# Patient Record
Sex: Female | Born: 1980 | Race: White | Hispanic: No | Marital: Married | Smoking: Never smoker
Health system: Southern US, Community
[De-identification: ages and names within clinical notes are randomized; demographics above are authoritative.]

## PROBLEM LIST (undated history)

## (undated) DIAGNOSIS — K635 Polyp of colon: Secondary | ICD-10-CM

## (undated) DIAGNOSIS — R109 Unspecified abdominal pain: Secondary | ICD-10-CM

## (undated) DIAGNOSIS — N920 Excessive and frequent menstruation with regular cycle: Secondary | ICD-10-CM

## (undated) DIAGNOSIS — N83209 Unspecified ovarian cyst, unspecified side: Secondary | ICD-10-CM

## (undated) DIAGNOSIS — E079 Disorder of thyroid, unspecified: Secondary | ICD-10-CM

## (undated) DIAGNOSIS — K625 Hemorrhage of anus and rectum: Secondary | ICD-10-CM

## (undated) DIAGNOSIS — K6289 Other specified diseases of anus and rectum: Secondary | ICD-10-CM

## (undated) HISTORY — DX: Polyp of colon: K63.5

## (undated) HISTORY — DX: Unspecified abdominal pain: R10.9

## (undated) HISTORY — DX: Other specified diseases of anus and rectum: K62.89

## (undated) HISTORY — DX: Hemorrhage of anus and rectum: K62.5

## (undated) HISTORY — PX: CYSTECTOMY: SUR359

## (undated) HISTORY — DX: Excessive and frequent menstruation with regular cycle: N92.0

## (undated) HISTORY — DX: Unspecified ovarian cyst, unspecified side: N83.209

## (undated) HISTORY — PX: CERVIX LESION DESTRUCTION: SHX591

## (undated) HISTORY — DX: Disorder of thyroid, unspecified: E07.9

---
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
## 2000-02-17 HISTORY — PX: CYSTECTOMY: SUR359

## 2000-11-09 ENCOUNTER — Ambulatory Visit (HOSPITAL_COMMUNITY): Admission: RE | Admit: 2000-11-09 | Discharge: 2000-11-09 | Payer: Self-pay | Admitting: General Surgery

## 2001-02-11 ENCOUNTER — Other Ambulatory Visit: Admission: RE | Admit: 2001-02-11 | Discharge: 2001-02-11 | Payer: Self-pay | Admitting: Obstetrics and Gynecology

## 2001-06-07 ENCOUNTER — Emergency Department (HOSPITAL_COMMUNITY): Admission: EM | Admit: 2001-06-07 | Discharge: 2001-06-07 | Payer: Self-pay | Admitting: Emergency Medicine

## 2002-02-06 ENCOUNTER — Encounter: Payer: Self-pay | Admitting: Family Medicine

## 2002-02-06 ENCOUNTER — Ambulatory Visit (HOSPITAL_COMMUNITY): Admission: RE | Admit: 2002-02-06 | Discharge: 2002-02-06 | Payer: Self-pay | Admitting: Family Medicine

## 2002-03-08 ENCOUNTER — Other Ambulatory Visit: Admission: RE | Admit: 2002-03-08 | Discharge: 2002-03-08 | Payer: Self-pay | Admitting: Obstetrics & Gynecology

## 2003-06-07 ENCOUNTER — Observation Stay (HOSPITAL_COMMUNITY): Admission: EM | Admit: 2003-06-07 | Discharge: 2003-06-08 | Payer: Self-pay | Admitting: Emergency Medicine

## 2004-03-21 ENCOUNTER — Ambulatory Visit (HOSPITAL_COMMUNITY): Admission: RE | Admit: 2004-03-21 | Discharge: 2004-03-21 | Payer: Self-pay | Admitting: Obstetrics and Gynecology

## 2004-12-03 ENCOUNTER — Emergency Department (HOSPITAL_COMMUNITY): Admission: EM | Admit: 2004-12-03 | Discharge: 2004-12-03 | Payer: Self-pay | Admitting: Emergency Medicine

## 2005-03-25 ENCOUNTER — Ambulatory Visit (HOSPITAL_COMMUNITY): Admission: RE | Admit: 2005-03-25 | Discharge: 2005-03-25 | Payer: Self-pay | Admitting: Obstetrics and Gynecology

## 2006-02-19 ENCOUNTER — Ambulatory Visit (HOSPITAL_COMMUNITY): Admission: AD | Admit: 2006-02-19 | Discharge: 2006-02-19 | Payer: Self-pay | Admitting: Obstetrics and Gynecology

## 2006-03-22 ENCOUNTER — Observation Stay (HOSPITAL_COMMUNITY): Admission: AD | Admit: 2006-03-22 | Discharge: 2006-03-22 | Payer: Self-pay | Admitting: Obstetrics and Gynecology

## 2006-05-03 ENCOUNTER — Inpatient Hospital Stay (HOSPITAL_COMMUNITY): Admission: RE | Admit: 2006-05-03 | Discharge: 2006-05-05 | Payer: Self-pay | Admitting: Obstetrics and Gynecology

## 2006-05-03 ENCOUNTER — Encounter (INDEPENDENT_AMBULATORY_CARE_PROVIDER_SITE_OTHER): Payer: Self-pay | Admitting: *Deleted

## 2006-10-20 ENCOUNTER — Ambulatory Visit (HOSPITAL_COMMUNITY): Admission: RE | Admit: 2006-10-20 | Discharge: 2006-10-20 | Payer: Self-pay | Admitting: Family Medicine

## 2006-11-03 ENCOUNTER — Ambulatory Visit (HOSPITAL_COMMUNITY): Admission: RE | Admit: 2006-11-03 | Discharge: 2006-11-03 | Payer: Self-pay | Admitting: Family Medicine

## 2007-03-29 ENCOUNTER — Ambulatory Visit: Payer: Self-pay | Admitting: Gastroenterology

## 2007-07-22 ENCOUNTER — Other Ambulatory Visit: Admission: RE | Admit: 2007-07-22 | Discharge: 2007-07-22 | Payer: Self-pay | Admitting: Obstetrics and Gynecology

## 2007-12-19 ENCOUNTER — Telehealth: Payer: Self-pay | Admitting: Gastroenterology

## 2007-12-21 DIAGNOSIS — K625 Hemorrhage of anus and rectum: Secondary | ICD-10-CM

## 2007-12-21 DIAGNOSIS — Z860100 Personal history of colon polyps, unspecified: Secondary | ICD-10-CM | POA: Insufficient documentation

## 2007-12-21 DIAGNOSIS — K6289 Other specified diseases of anus and rectum: Secondary | ICD-10-CM | POA: Insufficient documentation

## 2007-12-21 DIAGNOSIS — K649 Unspecified hemorrhoids: Secondary | ICD-10-CM | POA: Insufficient documentation

## 2007-12-21 DIAGNOSIS — R109 Unspecified abdominal pain: Secondary | ICD-10-CM | POA: Insufficient documentation

## 2007-12-21 DIAGNOSIS — K5900 Constipation, unspecified: Secondary | ICD-10-CM | POA: Insufficient documentation

## 2007-12-22 ENCOUNTER — Ambulatory Visit: Payer: Self-pay | Admitting: Gastroenterology

## 2007-12-22 LAB — CONVERTED CEMR LAB
ALT: 12 units/L (ref 0–35)
Alkaline Phosphatase: 66 units/L (ref 39–117)
Basophils Absolute: 0 10*3/uL (ref 0.0–0.1)
Basophils Relative: 0.4 % (ref 0.0–3.0)
Calcium: 9.4 mg/dL (ref 8.4–10.5)
Chloride: 105 meq/L (ref 96–112)
Creatinine, Ser: 0.7 mg/dL (ref 0.4–1.2)
Folate: 9.7 ng/mL
GFR calc Af Amer: 128 mL/min
HCT: 38.1 % (ref 36.0–46.0)
Hemoglobin: 13.3 g/dL (ref 12.0–15.0)
MCHC: 35 g/dL (ref 30.0–36.0)
Monocytes Absolute: 0.5 10*3/uL (ref 0.1–1.0)
Monocytes Relative: 8 % (ref 3.0–12.0)
RBC: 4.38 M/uL (ref 3.87–5.11)
RDW: 11.5 % (ref 11.5–14.6)
Saturation Ratios: 35.8 % (ref 20.0–50.0)
Sodium: 141 meq/L (ref 135–145)
TSH: 4.2 microintl units/mL (ref 0.35–5.50)
Total Protein: 7.6 g/dL (ref 6.0–8.3)
Vitamin B-12: 416 pg/mL (ref 211–911)

## 2007-12-26 ENCOUNTER — Ambulatory Visit: Payer: Self-pay | Admitting: Gastroenterology

## 2007-12-26 ENCOUNTER — Encounter: Payer: Self-pay | Admitting: Gastroenterology

## 2007-12-28 ENCOUNTER — Encounter: Payer: Self-pay | Admitting: Gastroenterology

## 2008-01-07 ENCOUNTER — Emergency Department (HOSPITAL_COMMUNITY): Admission: EM | Admit: 2008-01-07 | Discharge: 2008-01-07 | Payer: Self-pay | Admitting: Emergency Medicine

## 2008-09-26 ENCOUNTER — Ambulatory Visit: Payer: Self-pay

## 2008-09-26 DIAGNOSIS — R0989 Other specified symptoms and signs involving the circulatory and respiratory systems: Secondary | ICD-10-CM | POA: Insufficient documentation

## 2008-09-26 DIAGNOSIS — R000 Tachycardia, unspecified: Secondary | ICD-10-CM

## 2008-09-26 DIAGNOSIS — R079 Chest pain, unspecified: Secondary | ICD-10-CM

## 2008-09-28 ENCOUNTER — Encounter: Payer: Self-pay | Admitting: Adult Health

## 2008-09-28 LAB — CONVERTED CEMR LAB
BUN: 11 mg/dL (ref 6–23)
CO2: 23 meq/L (ref 19–32)
Calcium: 8.8 mg/dL (ref 8.4–10.5)
Glucose, Bld: 100 mg/dL — ABNORMAL HIGH (ref 70–99)
HCT: 37.5 % (ref 36.0–46.0)
Hemoglobin: 12.6 g/dL (ref 12.0–15.0)

## 2008-10-08 ENCOUNTER — Encounter: Payer: Self-pay | Admitting: Cardiology

## 2008-10-16 ENCOUNTER — Encounter: Payer: Self-pay | Admitting: Cardiology

## 2008-10-16 ENCOUNTER — Ambulatory Visit (HOSPITAL_COMMUNITY): Admission: RE | Admit: 2008-10-16 | Discharge: 2008-10-16 | Payer: Self-pay | Admitting: Cardiology

## 2008-10-16 ENCOUNTER — Ambulatory Visit: Payer: Self-pay | Admitting: Cardiology

## 2008-10-18 ENCOUNTER — Encounter (INDEPENDENT_AMBULATORY_CARE_PROVIDER_SITE_OTHER): Payer: Self-pay | Admitting: *Deleted

## 2008-10-24 ENCOUNTER — Ambulatory Visit: Payer: Self-pay | Admitting: Cardiology

## 2008-12-05 ENCOUNTER — Other Ambulatory Visit: Admission: RE | Admit: 2008-12-05 | Discharge: 2008-12-05 | Payer: Self-pay | Admitting: Obstetrics and Gynecology

## 2009-05-07 ENCOUNTER — Encounter: Payer: Self-pay | Admitting: Adult Health

## 2009-10-28 ENCOUNTER — Ambulatory Visit (HOSPITAL_COMMUNITY): Admission: RE | Admit: 2009-10-28 | Discharge: 2009-10-28 | Payer: Self-pay | Admitting: Internal Medicine

## 2010-01-10 ENCOUNTER — Inpatient Hospital Stay (HOSPITAL_COMMUNITY)
Admission: AD | Admit: 2010-01-10 | Discharge: 2010-01-10 | Payer: Self-pay | Source: Home / Self Care | Admitting: Obstetrics & Gynecology

## 2010-01-24 ENCOUNTER — Inpatient Hospital Stay (HOSPITAL_COMMUNITY)
Admission: AD | Admit: 2010-01-24 | Discharge: 2010-01-24 | Payer: Self-pay | Source: Home / Self Care | Attending: Family Medicine | Admitting: Family Medicine

## 2010-01-26 ENCOUNTER — Inpatient Hospital Stay
Admission: AD | Admit: 2010-01-26 | Discharge: 2010-01-26 | Payer: Self-pay | Source: Home / Self Care | Attending: Obstetrics & Gynecology | Admitting: Obstetrics & Gynecology

## 2010-01-27 ENCOUNTER — Inpatient Hospital Stay (HOSPITAL_COMMUNITY)
Admission: AD | Admit: 2010-01-27 | Discharge: 2010-01-29 | Payer: Self-pay | Source: Home / Self Care | Attending: Obstetrics & Gynecology | Admitting: Obstetrics & Gynecology

## 2010-01-28 ENCOUNTER — Encounter: Payer: Self-pay | Admitting: Obstetrics & Gynecology

## 2010-04-29 LAB — URINALYSIS, ROUTINE W REFLEX MICROSCOPIC
Bilirubin Urine: NEGATIVE
Hgb urine dipstick: NEGATIVE
Ketones, ur: 40 mg/dL — AB

## 2010-04-29 LAB — CBC
Hemoglobin: 10.5 g/dL — ABNORMAL LOW (ref 12.0–15.0)
MCH: 26.5 pg (ref 26.0–34.0)
Platelets: 396 10*3/uL (ref 150–400)
RBC: 3.97 MIL/uL (ref 3.87–5.11)
RDW: 13 % (ref 11.5–15.5)
WBC: 20.1 10*3/uL — ABNORMAL HIGH (ref 4.0–10.5)

## 2010-04-29 LAB — RPR: RPR Ser Ql: NONREACTIVE

## 2010-04-29 LAB — AMNISURE RUPTURE OF MEMBRANE (ROM) NOT AT ARMC: Amnisure ROM: NEGATIVE

## 2010-07-01 NOTE — Assessment & Plan Note (Signed)
[name], [name]                  CHART#:  [id number]   DATE:  03/29/2007                       DOB:  June 03, 1980   DATE OF VISIT:  03/29/2007   PRIMARY PHYSICIAN:  Elpidio Eric   REASON FOR VISIT:  Rectal bleeding.   HISTORY OF PRESENT ILLNESS:  [name] is a 30 year old female who has  had difficulty moving her bowels since she was a teenager.  She reports  having a bowel movement once a week which is hard and associated with  rectal bleeding.  She reports having exploratory laparotomy for the  possibility of Crohn disease which was negative.  She does have a  maternal grandfather who has Crohn disease.  She has used Proctofoam in  the past for hemorrhoids.  She has associated abdominal di  abdominal  discomfort, cramping and tearing pain in her rectum, and she has a bowel  movement.  She has tried Dulcolax and Colace without satisfactory relief  of her constipation.  Caster oil does help.  She denies any weight loss.  She is nauseated and vomits one to two times a week which are associated  with bad bouts of constipation.  She denies any sores in her mouth or  rash on her legs.  She does not have any diarrhea.  She is breast  feeding.  The child was 93 months old.  Fiber bars cause bloating.  She  denies any heartburn, indigestion or dysphagia.  Her appetite is okay.  Sometimes, when she burps, she said it smells like a bowel movement.  She had her last abdominal imaging in February of 2008 for right upper  quadrant pain, and the exam was completely normal.   PAST MEDICAL HISTORY:  She reports having a history of polyps, but those  records are unavailable.   PAST SURGICAL HISTORY:  Appendectomy.   ALLERGIES:  NO KNOWN DRUG ALLERGIES.   MEDICATIONS:  1. Multivitamin.  2. Proctofoam as needed.  3. Colace as needed.  4. Dulcolax suppositories as needed.  5. Tylenol as needed.   FAMILY HISTORY:  She has no family history of colon cancer and colon  polyps.   SOCIAL  HISTORY:  She is married and she has two children.  Her children  have no GI issues.  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  She does not smoke or  drink any alcohol.   REVIEW OF SYSTEMS:  Per the HPI, otherwise all systems are negative.   PHYSICAL EXAMINATION:  VITAL SIGNS:  Weight 138 pounds, height 5 foot, 4  inches, BMI 23.7 (healthy), temperature 98.2, blood pressure 100/60,  pulse 72.GENERAL:  She is in no apparent distress, alert and oriented  x4.HEENT EXAM:  Atraumatic, normocephalic, pupils are equal and react to  light.  Mouth:  No oral lesions.  Posterior pharynx without erythema or  exudate.NECK:  Has full range of motion, no lymphadenopathy.LUNGS:  Clear to auscultation bilaterally.CARDIOVASCULAR EXAM:  Regular rhythm,  no murmur, no S1, S2.ABDOMEN:  Bowel sounds are present, soft,  nontender, nondistended, no rebound or guarding.  She has no abdominal  bruits.NEUROLOGIC:  She has no focal neurologic deficits.EXTREMITIES:  No cyanosis or edema.  SKIN:  No pretibial lesions.   ASSESSMENT:  [name] is a 30 year old female who has a longstanding  history of constipation which is likely  secondary to irritable bowel  syndrome.  Differential diagnosis includes thyroid disturbance or  electrolyte disturbance.   RECOMMENDATIONS:  1. She used to take Amitiza 8 mcg twice daily.  She has given four      samples, and a prescription.  2. She was given instructions to add Colace twice a day and to      continue a high-fiber diet.  She is to find the fiber that works      for her and to avoid bloating.  3. Will check the CMP and the TSH to evaluate for electrolyte or      thyroid disturbance.  4. Outpatient visit in two months.  I will obtain record from [facility] in regards to her prior workup and determine whether or not a      colonoscopy is necessary in the future.       Kassie Mends, M.D.  Electronically Signed     SM/MEDQ  D:  03/29/2007  T:  03/30/2007  Job:   [id number]   cc:   Patrica Duel, M.D.

## 2010-07-04 NOTE — Op Note (Signed)
[name], [name]                 ACCOUNT NO.:  [id number]   MEDICAL RECORD NO.:  [id number]          PATIENT TYPE:  INP   LOCATION:  A412                          FACILITY:  [facility]   PHYSICIAN:  Tilda Burrow, M.D. DATE OF BIRTH:  05/23/1980   DATE OF PROCEDURE:  05/03/2006  DATE OF DISCHARGE:                                PROCEDURE NOTE   PROCEDURE:  Epidural catheter placement.   ATTENDING:  Tilda Burrow, M.D.   TIME:  11 a.m.   DESCRIPTION OF PROCEDURE:  A continuous lumbar epidural catheter was  placed with the patient in the sitting position and flexed forward with  a loss-of-resistance technique attempted.  First attempt at L2-3  interspace was unsuccessful due to bony obstruction, despite the fact  the patient had a slim back.  We moved down 1 interspace and attempted  again and though the interspace was quite narrow, we were able to  identify the interspace at a 6-cm depth using standard loss-of-  resistance technique.  The catheter threaded 3 cm into the space without  difficulty.  We gave her a 3-mL test dose of 1.5%  Xylocaine with  epinephrine prior to insertion of the catheter.  We then proceeded  directly to the test dose, 7 mL of 0.125% Marcaine solution with 2  mcg/mL of fentanyl.  The patient had a slow setup of the analgesic  effect and at 15 minutes, claimed that she was not getting any relief,  but subsequently moved rapidly in labor and showed evidence of analgesia  during the labor and delivery process.   Catheter had been taped to the back in a standard fashion; see nursing  notes for further details.      Tilda Burrow, M.D.  Electronically Signed     JVF/MEDQ  D:  05/03/2006  T:  05/04/2006  Job:  [id number]   cc:   [facility]

## 2010-07-04 NOTE — H&P (Signed)
NAMEKIRRA, VERGA                 ACCOUNT NO.:  [id number]   MEDICAL RECORD NO.:  [id number]          PATIENT TYPE:  OIB   LOCATION:  LDR3                          FACILITY:  [facility]   PHYSICIAN:  Tilda Burrow, M.D. DATE OF BIRTH:  1980-11-17   DATE OF ADMISSION:  DATE OF DISCHARGE:  LH                              HISTORY & PHYSICAL   REASON FOR ADMISSION:  Pregnancy at 34 weeks and 3 days, who awoke this  morning with heavy bleeding and cramping and presents to the office for  evaluation.   MEDICAL HISTORY:  Positive for abnormal Paps.   SURGICAL HISTORY:  Positive for appendectomy and ovarian cyst removal  and cryo on her cervix.   FAMILY HISTORY:  Positive for breast cancer, coronary artery disease and  diabetes.   PRENATAL COURSE:  Essentially uneventful, up until this point.  Blood  type is O positive, UDS negative.  Rubella is immune.  Hep B surface  antigen is negative.  HIV is nonreactive.  HSV:  1 is positive, 2 is  negative.  Serology is nonreactive.  Pap normal.  GC and chlamydia are  negative, AFP normal, 28-week hemoglobin 9.5, 28-week hematocrit 34.5.  One-hour glucose was 100.   PHYSICAL EXAMINATION:  VITAL SIGNS:  Today:  Weight is 165, blood  pressure 100/60.  She has 3+ blood in her urine.  She is having some  small amount of darkish bleeding at present time.  Fetal heart rate is  160, strong and regular.  ABDOMEN:  Soft but tender with palpation.  Patient states there is good  fetal movement noted.  HEART:  Regular rhythm and rate.  LUNGS:  Clear to auscultation bilaterally.   PLAN:  We are going to admit, observation, get an ultrasound to evaluate  fetal well-being and to rule out abruptions.      [name], [name]      Tilda Burrow, M.D.  Electronically Signed    DL/MEDQ  D:  16/11/9602  T:  03/22/2006  Job:  [id number]

## 2010-07-04 NOTE — Op Note (Signed)
[name], [name]                 ACCOUNT NO.:  [id number]   MEDICAL RECORD NO.:  [id number]          PATIENT TYPE:  INP   LOCATION:  LDR4                          FACILITY:  [facility]   PHYSICIAN:  Tilda Burrow, M.D. DATE OF BIRTH:  Nov 19, 1980   DATE OF PROCEDURE:  05/03/2006  DATE OF DISCHARGE:                                PROCEDURE NOTE   DELIVERY SUMMARY:   ONSET OF LABOR:  May 03, 2006.   DATE OF DELIVERY:  May 03, 2006 at 12:03.   LENGTH OF FIRST STAGE LABOR:  Four hours and 50 minutes.   LENGTH OF SECOND STAGE LABOR:  Thirteen minutes.   LENGTH OF THIRD STAGE LABOR:  Seventeen minutes.   DELIVERY NOTE:  [name] had a normal spontaneous vaginal delivery.  Upon  delivery of head, there was a loose nuchal cord noted which was easily  reduced.  The shoulders were trapped momentarily under the symphysis  pubis.  McRobert's procedure was performed, and also wood screw maneuver  which resolved the trapped shoulders and had rotation and spontaneous  delivery of the infant without complications.  Apgars were 9 and 9.  There was a second degree perineal laceration upon delivery of the  infant.  It was thoroughly suctioned and DeLee'd on the perineum,  suctioned again after the baby was delivered, the infant dried, cord  clamped and cut, and to the newborn nurses for nursery care.  The third  stage of labor was actually managed with 20 units of Pitocin, 1000 cc of  D-5 lactated Ringers at a rapid rate.  Cord blood and cord blood gas was  obtained.  Placenta was delivered spontaneously via Tomasa Blase mechanism.  A 3-vessel cord was noted.  Membranes were noted to be intact upon  inspection.  Estimated blood loss approximately 400 cc.  A second degree  perineal laceration was repaired with 10 cc of 1% lidocaine and  repaired with 2 interrupted sutures and subcuticular to close with 2-0  Vicryl.  Good hemostasis was obtained.  Infant and mother stabilized.  Epidural catheter was  removed with blue chip intact.  Both were  transferred out to the postpartum unit in good condition.      [name], [name]      Tilda Burrow, M.D.  Electronically Signed    DL/MEDQ  D:  11/91/4782  T:  05/03/2006  Job:  [id number]   cc:   Dr. Gerda Diss   [facility]

## 2010-07-04 NOTE — H&P (Signed)
[name], [name]                 ACCOUNT NO.:  [id number]   MEDICAL RECORD NO.:  [id number]          PATIENT TYPE:  INP   LOCATION:  LDR4                          FACILITY:  [facility]   PHYSICIAN:  Tilda Burrow, M.D. DATE OF BIRTH:  [birth year]   DATE OF ADMISSION:  DATE OF DISCHARGE:  LH                              HISTORY & PHYSICAL   CHIEF COMPLAINT:  Elective induction of labor secondary to post dates.   HISTORY OF PRESENT ILLNESS:  [name] is a 30 year old gravida 2, para 1  with an EDC of May 01, 2006 based on known last menstrual period and  first and second trimester correlating ultrasounds placing her at 40  weeks 3 days gestation. She began prenatal care in the first trimester  and has had regular visits since then.  Prenatal course has been  completely uneventful. Blood pressures have been 100s to 120s over 60s  to 80s. Total weight gain has been 30 pounds with appropriate fundal  height growth. Prenatal labs include blood type 0+, rubella immune,  HBsAg, HIV, RPR, gonorrhea, Chlamydia are all negative. Group B strep is  positive, varicella is negative. HSV type 2 as also negative.   PAST MEDICAL HISTORY:  Positive for hematuria with negative workup.   SURGICAL HISTORY:  Appendectomy, ovarian cyst removal, and cryosurgery.   ALLERGIES:  No allergies.   MEDICATIONS:  Prenatal vitamins.   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   FAMILY HISTORY:  Positive for breast cancer, CAD, diabetes.   OBSTETRIC HISTORY:  Had a 12-hour labor that resulted in an 8 pound 2  ounce female infant delivered spontaneously with an epidural.   PHYSICAL EXAMINATION:  HEENT: Within normal limits.  HEART:  Regular rate and rhythm.  LUNGS:  Clear.  ABDOMEN:  Soft and nontender. Fundal height is 40 cm. Cervical exam  today was 1 cm long,  -1 station.  EXTREMITIES:  Legs are negative.   IMPRESSION:  IUP at 40-1/2 weeks, elective induction of labor per  maternal  request.   PLAN:  Will admit on the evening of May 03, 2006 and plan a Foley  preinduction with AROM and Pitocin in the morning.      Jacklyn Shell, C.N.M.      Tilda Burrow, M.D.  Electronically Signed    FC/MEDQ  D:  04/27/2006  T:  04/29/2006  Job:  [id number]   cc:   Lorin Picket A. Gerda Diss, MD  Fax: [phone]

## 2010-07-04 NOTE — Discharge Summary (Signed)
NAMETASHEEMA, PERRONE                          ACCOUNT NO.:  [id number]   MEDICAL RECORD NO.:  [id number]                   PATIENT TYPE:  INP   LOCATION:  A331                                 FACILITY:  [facility]   PHYSICIAN:  Corrie Mckusick, M.D.               DATE OF BIRTH:  Jun 22, 1980   DATE OF ADMISSION:  06/07/2003  DATE OF DISCHARGE:  06/08/2003                                 DISCHARGE SUMMARY   HISTORY OF PRESENTING ILLNESS/PAST MEDICAL HISTORY:  Please see admission  H&P.   HOSPITAL COURSE:  [age]-year-old female with no significant past  medical history who presented with 12 hours of acute abdominal cramps,  nausea, vomiting, and diarrhea.  She was found to be hypotensive in the  emergency department and felt like she should be admitted for overnight  fluids and stabilization.  She has done remarkably well since admission and  has had much less nausea which is nearly resolved this morning and diarrhea  has slowed down as well.  Blood pressures are starting to come back up in  the mid 90s/mid 50s which is her baseline blood pressures.  She feels  remarkably well and has been afebrile.   DISCHARGE PHYSICAL:  T-max 99, blood pressure 96/55, heart rate in 90s,  respiratory rate 18.  This morning she was pleasant, talkative, joking and  ready to go home.  Nasopharynx moist mucous membranes.  Chest clear to  auscultation bilaterally.  Cardiovascular regular rate and rhythm; no  murmurs.  Abdomen soft, nontender.   DISCHARGE MEDICATION:  Phenergan 25 mg q.4-6h. p.r.n.   FOLLOW UP:  She is to follow up as needed.  If symptoms reoccur she is to  call or return.     ___________________________________________                                         Corrie Mckusick, M.D.   JCG/MEDQ  D:  06/08/2003  T:  06/08/2003  Job:  [id number]

## 2010-07-04 NOTE — H&P (Signed)
[name] [name]                          ACCOUNT NO.:  [id number]   MEDICAL RECORD NO.:  [id number]                   PATIENT TYPE:  INP   LOCATION:  A331                                 FACILITY:  [facility]   PHYSICIAN:  Corrie Mckusick, M.D.               DATE OF BIRTH:  Oct 06, 1980   DATE OF ADMISSION:  06/07/2003  DATE OF DISCHARGE:                                HISTORY & PHYSICAL   ADMITTING DIAGNOSES:  Gastroenteritis.   ADMITTING CONDITION:  Stable.   HISTORY OF PRESENT ILLNESS:  This is a 30 year old female with no  significant past medical history who has now had 12-16 hours of mid  epigastric nausea, vomiting and cramping.  She has really been able to take  no p.o., and we felt like we should admit her for IV hydration.  She had a  history of pyelonephritis in the past, but has really done well the past few  years.  No blood in the vomitus.  No blood in the diarrhea, mostly just  watery diarrhea.  No recent travel.  No recent foods.  No recent fresh  water.  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   PAST MEDICAL/ PAST SURGICAL HISTORY:  Appendectomy, tonsillectomy in 1988,  appendectomy in 1991.  A cyst removal of her left wrist in 2002.  She does  not drink or smoke.   FAMILY HISTORY:  Noncontributory.   MEDICATIONS:  Birth control pills.   PHYSICAL EXAMINATION:  VITAL SIGNS:  Temperature 100.4, pulse 118,  respiratory rate 20, blood pressure 87/55.  GENERAL:  When I saw her, she was pleasant, sleepy and in no acute distress.  HEENT:  Nasal and oropharynx with moist mucous membranes.  NECK:  No lymphadenopathy.  CHEST:  Clear to auscultation bilaterally.  CARDIOVASCULAR:  Regular rate and rhythm, no murmurs.  ABDOMEN:  Bowel sounds are decreased, soft, mildly tender around the  periumbilical area.  Otherwise clear exam.  EXTREMITIES:  No edema.   LABORATORY DATA:  White count slightly elevated at  11, otherwise clear.  Urinalysis clear.  HCG negative.   ASSESSMENT:  A 30-old gastroenteritis.   PLAN:  1. Admit for IV hydration.  2. Zofran and Phenergan for nausea.  3. We will continue to follow closely and do frequent abdominal to insure     the symptoms do not change.     ___________________________________________                                         Corrie Mckusick, M.D.   JCG/MEDQ  D:  06/07/2003  T:  06/08/2003  Job:  [id number]

## 2010-11-18 LAB — DIFFERENTIAL
Lymphocytes Relative: 18
Lymphs Abs: 2.7
Monocytes Absolute: 0.7
Monocytes Relative: 5
Neutro Abs: 11 — ABNORMAL HIGH
Neutrophils Relative %: 74

## 2010-11-18 LAB — URINALYSIS, ROUTINE W REFLEX MICROSCOPIC
Bilirubin Urine: NEGATIVE
Glucose, UA: NEGATIVE
Ketones, ur: NEGATIVE
Nitrite: POSITIVE — AB
Protein, ur: 100 — AB
Specific Gravity, Urine: >1.03 — ABNORMAL HIGH
Urobilinogen, UA: 0.2
pH: 5.5

## 2010-11-18 LAB — CBC
HCT: 34.8 — ABNORMAL LOW
Hemoglobin: 11.7 — ABNORMAL LOW
MCHC: 33.8
MCV: 86.1
Platelets: 331
RBC: 4.04
RDW: 12.6
WBC: 14.8 — ABNORMAL HIGH

## 2010-11-18 LAB — URINE CULTURE: Colony Count: >=100000

## 2010-11-18 LAB — COMPREHENSIVE METABOLIC PANEL WITH GFR
ALT: 9
AST: 14
Albumin: 3.6
Alkaline Phosphatase: 57
BUN: 7
CO2: 24
Calcium: 8.6
Chloride: 110
Creatinine, Ser: 0.6
GFR calc non Af Amer: >60
Glucose, Bld: 93
Potassium: 3.5
Sodium: 137
Total Bilirubin: 1.1
Total Protein: 6.2

## 2010-11-18 LAB — URINE MICROSCOPIC-ADD ON

## 2010-11-18 LAB — D-DIMER, QUANTITATIVE

## 2010-11-18 LAB — PREGNANCY, URINE: Preg Test, Ur: NEGATIVE

## 2010-12-12 ENCOUNTER — Other Ambulatory Visit (HOSPITAL_COMMUNITY)
Admission: RE | Admit: 2010-12-12 | Discharge: 2010-12-12 | Disposition: A | Discharge status: Home / Self Care | Payer: 59 | Source: Ambulatory Visit | Attending: Obstetrics and Gynecology | Admitting: Obstetrics and Gynecology

## 2010-12-12 ENCOUNTER — Other Ambulatory Visit: Payer: Self-pay | Admitting: Adult Health

## 2010-12-12 DIAGNOSIS — Z01419 Encounter for gynecological examination (general) (routine) without abnormal findings: Secondary | ICD-10-CM | POA: Insufficient documentation

## 2011-02-05 ENCOUNTER — Encounter: Payer: Self-pay | Admitting: Cardiology

## 2011-11-25 ENCOUNTER — Other Ambulatory Visit: Payer: Self-pay | Admitting: Dermatology

## 2012-03-29 ENCOUNTER — Other Ambulatory Visit: Payer: Self-pay | Admitting: Adult Health

## 2012-03-29 ENCOUNTER — Other Ambulatory Visit (HOSPITAL_COMMUNITY)
Admission: RE | Admit: 2012-03-29 | Discharge: 2012-03-29 | Disposition: A | Discharge status: Home / Self Care | Payer: BC Managed Care – PPO | Source: Ambulatory Visit | Attending: Obstetrics and Gynecology | Admitting: Obstetrics and Gynecology

## 2012-03-29 ENCOUNTER — Other Ambulatory Visit (HOSPITAL_COMMUNITY): Payer: Self-pay | Admitting: Adult Health

## 2012-03-29 DIAGNOSIS — Z1151 Encounter for screening for human papillomavirus (HPV): Secondary | ICD-10-CM | POA: Insufficient documentation

## 2012-03-29 DIAGNOSIS — R52 Pain, unspecified: Secondary | ICD-10-CM

## 2012-03-29 DIAGNOSIS — Z01419 Encounter for gynecological examination (general) (routine) without abnormal findings: Secondary | ICD-10-CM | POA: Insufficient documentation

## 2012-03-29 DIAGNOSIS — N63 Unspecified lump in unspecified breast: Secondary | ICD-10-CM

## 2012-03-30 ENCOUNTER — Ambulatory Visit (HOSPITAL_COMMUNITY)
Admission: RE | Admit: 2012-03-30 | Discharge: 2012-03-30 | Disposition: A | Discharge status: Home / Self Care | Payer: BC Managed Care – PPO | Source: Ambulatory Visit | Attending: Adult Health | Admitting: Adult Health

## 2012-03-30 ENCOUNTER — Other Ambulatory Visit (HOSPITAL_COMMUNITY): Payer: Self-pay | Admitting: Adult Health

## 2012-03-30 DIAGNOSIS — N63 Unspecified lump in unspecified breast: Secondary | ICD-10-CM

## 2012-03-30 DIAGNOSIS — R52 Pain, unspecified: Secondary | ICD-10-CM

## 2013-04-03 ENCOUNTER — Other Ambulatory Visit: Payer: Self-pay | Admitting: Adult Health

## 2013-04-19 ENCOUNTER — Other Ambulatory Visit: Payer: Self-pay | Admitting: Adult Health

## 2013-05-02 ENCOUNTER — Encounter: Payer: Self-pay | Admitting: Adult Health

## 2013-05-02 ENCOUNTER — Encounter (INDEPENDENT_AMBULATORY_CARE_PROVIDER_SITE_OTHER): Payer: Self-pay

## 2013-05-02 ENCOUNTER — Ambulatory Visit (INDEPENDENT_AMBULATORY_CARE_PROVIDER_SITE_OTHER): Payer: BC Managed Care – PPO | Admitting: Adult Health

## 2013-05-02 VITALS — BP 120/80 | HR 74 | Ht 64.0 in | Wt 178.5 lb

## 2013-05-02 DIAGNOSIS — N920 Excessive and frequent menstruation with regular cycle: Secondary | ICD-10-CM

## 2013-05-02 DIAGNOSIS — Z01419 Encounter for gynecological examination (general) (routine) without abnormal findings: Secondary | ICD-10-CM

## 2013-05-02 HISTORY — DX: Excessive and frequent menstruation with regular cycle: N92.0

## 2013-05-02 NOTE — Patient Instructions (Signed)
Menorrhagia Menorrhagia is the medical term for when your menstrual periods are heavy or last longer than usual. With menorrhagia, every period you have may cause enough blood loss and cramping that you are unable to maintain your usual activities. CAUSES  In some cases, the cause of heavy periods is unknown, but a number of conditions may cause menorrhagia. Common causes include:  A problem with the hormone-producing thyroid gland (hypothyroid).  Noncancerous growths in the uterus (polyps or fibroids).  An imbalance of the estrogen and progesterone hormones.  One of your ovaries not releasing an egg during one or more months.  Side effects of having an intrauterine device (IUD).  Side effects of some medicines, such as anti-inflammatory medicines or blood thinners.  A bleeding disorder that stops your blood from clotting normally. SIGNS AND SYMPTOMS  During a normal period, bleeding lasts between 4 and 8 days. Signs that your periods are too heavy include:  You routinely have to change your pad or tampon every 1 or 2 hours because it is completely soaked.  You pass blood clots larger than 1 inch (2.5 cm) in size.  You have bleeding for more than 7 days.  You need to use pads and tampons at the same time because of heavy bleeding.  You need to wake up to change your pads or tampons during the night.  You have symptoms of anemia, such as tiredness, fatigue, or shortness of breath. DIAGNOSIS  Your health care provider will perform a physical exam and ask you questions about your symptoms and menstrual history. Other tests may be ordered based on what the health care provider finds during the exam. These tests can include:  Blood tests To check if you are pregnant or have hormonal changes, a bleeding or thyroid disorder, low iron levels (anemia), or other problems.  Endometrial biopsy Your health care provider takes a sample of tissue from the inside of your uterus to be examined  under a microscope.  Pelvic ultrasound This test uses sound waves to make a picture of your uterus, ovaries, and vagina. The pictures can show if you have fibroids or other growths.  Hysteroscopy For this test, your health care provider will use a small telescope to look inside your uterus. Based on the results of your initial tests, your health care provider may recommend further testing. TREATMENT  Treatment may not be needed. If it is needed, your health care provider may recommend treatment with one or more medicines first. If these do not reduce bleeding enough, a surgical treatment might be an option. The best treatment for you will depend on:   Whether you need to prevent pregnancy.  Your desire to have children in the future.  The cause and severity of your bleeding.  Your opinion and personal preference.  Medicines for menorrhagia may include:  Birth control methods that use hormones These include the pill, skin patch, vaginal ring, shots that you get every 3 months, hormonal IUD, and implant. These treatments reduce bleeding during your menstrual period.  Medicines that thicken blood and slow bleeding.  Medicines that reduce swelling, such as ibuprofen.  Medicines that contain a synthetic hormone called progestin.   Medicines that make the ovaries stop working for a short time.  You may need surgical treatment for menorrhagia if the medicines are unsuccessful. Treatment options include:  Dilation and curettage (D&C) In this procedure, your health care provider opens (dilates) your cervix and then scrapes or suctions tissue from the lining of your  uterus to reduce menstrual bleeding.  Operative hysteroscopy This procedure uses a tiny tube with a light (hysteroscope) to view your uterine cavity and can help in the surgical removal of a polyp that may be causing heavy periods.  Endometrial ablation Through various techniques, your health care provider permanently  destroys the entire lining of your uterus (endometrium). After endometrial ablation, most women have little or no menstrual flow. Endometrial ablation reduces your ability to become pregnant.  Endometrial resection This surgical procedure uses an electrosurgical wire loop to remove the lining of the uterus. This procedure also reduces your ability to become pregnant.  Hysterectomy Surgical removal of the uterus and cervix is a permanent procedure that stops menstrual periods. Pregnancy is not possible after a hysterectomy. This procedure requires anesthesia and hospitalization. HOME CARE INSTRUCTIONS   Only take over-the-counter or prescription medicines as directed by your health care provider. Take prescribed medicines exactly as directed. Do not change or switch medicines without consulting your health care provider.  Take any prescribed iron pills exactly as directed by your health care provider. Long-term heavy bleeding may result in low iron levels. Iron pills help replace the iron your body lost from heavy bleeding. Iron may cause constipation. If this becomes a problem, increase the bran, fruits, and roughage in your diet.  Do not take aspirin or medicines that contain aspirin 1 week before or during your menstrual period. Aspirin may make the bleeding worse.  If you need to change your sanitary pad or tampon more than once every 2 hours, stay in bed and rest as much as possible until the bleeding stops.  Eat well-balanced meals. Eat foods high in iron. Examples are leafy green vegetables, meat, liver, eggs, and whole grain breads and cereals. Do not try to lose weight until the abnormal bleeding has stopped and your blood iron level is back to normal. SEEK MEDICAL CARE IF:   You soak through a pad or tampon every 1 or 2 hours, and this happens every time you have a period.  You need to use pads and tampons at the same time because you are bleeding so much.  You need to change your pad  or tampon during the night.  You have a period that lasts for more than 8 days.  You pass clots bigger than 1 inch wide.  You have irregular periods that happen more or less often than once a month.  You feel dizzy or faint.  You feel very weak or tired.  You feel short of breath or feel your heart is beating too fast when you exercise.  You have nausea and vomiting or diarrhea while you are taking your medicine.  You have any problems that may be related to the medicine you are taking. SEEK IMMEDIATE MEDICAL CARE IF:   You soak through 4 or more pads or tampons in 2 hours.  You have any bleeding while you are pregnant. MAKE SURE YOU:   Understand these instructions.  Will watch your condition.  Will get help right away if you are not doing well or get worse. Document Released: 02/02/2005 Document Revised: 11/23/2012 Document Reviewed: 07/24/2012 [facility] LLCExit[facility] Patient Information 2014 [place], [place]. Physical in 1 year Return in 3 weeks for US and see me

## 2013-05-02 NOTE — Progress Notes (Signed)
Patient ID: [name], female   DOB: 10/13/1980, 33 y.o.   MRN: [id number] History of Present Illness:  [name] is a 33 year old white female, married in for a physical.she had a normal pap with negative HPV 03/29/12.  Current Medications, Allergies, Past Medical History, Past Surgical History, Family History and Social History were reviewed in [facility] electronic medical record.     Review of Systems: Patient denies any headaches, blurred vision, shortness of breath, chest pain, abdominal pain, problems with bowel movements, urination, or intercourse. No joint pain or mood swings, she is having heavier periods with clots.    Physical Exam:BP 120/80  Pulse 74  Ht 5\' 4"  (1.626 m)  Wt 178 lb 8 oz (80.967 kg)  BMI 30.62 kg/m2  LMP 04/18/2013 General:  Well developed, well nourished, no acute distress Skin:  Warm and dry Neck:  Midline trachea, normal thyroid Lungs; Clear to auscultation bilaterally Breast:  No dominant palpable mass, retraction, or nipple discharge Cardiovascular: Regular rate and rhythm Abdomen:  Soft, non tender, no hepatosplenomegaly Pelvic:  External genitalia is normal in appearance.  The vagina is normal in appearance. The cervix is bulbous.  Uterus is felt to be normal size, shape, and contour.  No   adnexal masses or tenderness noted. Extremities:  No swelling or varicosities noted Psych:  No mood changes, alert and cooperative, seems happy   Impression: Yearly gyn exam no pap Menorrhagia     Plan: Physical in 1 year Return in 3 weeks for US and see me and check TSH then Review handout on menorrhagia

## 2013-05-18 ENCOUNTER — Other Ambulatory Visit: Payer: Self-pay | Admitting: Adult Health

## 2013-05-18 DIAGNOSIS — N920 Excessive and frequent menstruation with regular cycle: Secondary | ICD-10-CM

## 2013-05-23 ENCOUNTER — Other Ambulatory Visit: Payer: BC Managed Care – PPO

## 2013-05-23 ENCOUNTER — Ambulatory Visit: Payer: BC Managed Care – PPO | Admitting: Adult Health

## 2013-05-30 ENCOUNTER — Ambulatory Visit (INDEPENDENT_AMBULATORY_CARE_PROVIDER_SITE_OTHER): Payer: BC Managed Care – PPO

## 2013-05-30 ENCOUNTER — Ambulatory Visit (INDEPENDENT_AMBULATORY_CARE_PROVIDER_SITE_OTHER): Payer: BC Managed Care – PPO | Admitting: Adult Health

## 2013-05-30 ENCOUNTER — Encounter: Payer: Self-pay | Admitting: Adult Health

## 2013-05-30 VITALS — BP 120/80 | Ht 64.0 in | Wt 178.0 lb

## 2013-05-30 DIAGNOSIS — N920 Excessive and frequent menstruation with regular cycle: Secondary | ICD-10-CM

## 2013-05-30 DIAGNOSIS — N83209 Unspecified ovarian cyst, unspecified side: Secondary | ICD-10-CM

## 2013-05-30 HISTORY — DX: Unspecified ovarian cyst, unspecified side: N83.209

## 2013-05-30 NOTE — Progress Notes (Signed)
Subjective:     Patient ID: [name], female   DOB: 01/27/1981, 33 y.o.   MRN: [id number]  HPI [name] is a 33 year old white female, married in for US for menorrhagia.   Review of Systems See HPI Reviewed past medical,surgical, social and family history. Reviewed medications and allergies.     Objective:   Physical Exam BP 120/80  Ht 5\' 4"  (1.626 m)  Wt 178 lb (80.74 kg)  BMI 30.54 kg/m2  LMP 04/03/2015reviewed US with pt,   Uterus 8.5 x 6.5 x 4.8 cm, no myometrial masses noted  Endometrium 12.6 mm, no definite mass noted within cavity although asymmetrical wall appearance within fundal area of cavity  Right ovary 4.6 x 2.8 x 2.7 cm, with 2.4 x 2.3cm complex cystic area noted, (internal debris and septations noted within, no +Doppler flow noted within  Left ovary 3.7 x 2.3 x 2.0 cm,  No free fluid noted within pelvis  Technician Comments:  Anteverted uterus, no myometrial masses noted, asymmetrical endometrial wall appearance(no definite mass noted within cavity), Rt ovary with 2.4 x 2.3 cm complex cystic area noted, lt ovary appears wnl, no free fluid noted within pelvis  Discussed options of OCs, IUD,megace, or ablation or just watching, will watch for now,declines any birth control.   Assessment:     Menorrhagia Right ovarian cyst    Plan:    Will talk in am Check TSH Review handout on ablation

## 2013-05-30 NOTE — Patient Instructions (Signed)
Will talk in am  

## 2013-05-31 ENCOUNTER — Telehealth: Payer: Self-pay | Admitting: Adult Health

## 2013-05-31 LAB — TSH: TSH: 2.436 u[IU]/mL (ref 0.350–4.500)

## 2013-05-31 NOTE — Telephone Encounter (Signed)
Left message TSH 2.436

## 2013-07-13 ENCOUNTER — Other Ambulatory Visit (HOSPITAL_COMMUNITY): Payer: Self-pay | Admitting: Family Medicine

## 2013-07-13 DIAGNOSIS — R1312 Dysphagia, oropharyngeal phase: Secondary | ICD-10-CM

## 2013-07-18 ENCOUNTER — Ambulatory Visit (HOSPITAL_COMMUNITY)
Admission: RE | Admit: 2013-07-18 | Discharge: 2013-07-18 | Disposition: A | Discharge status: Home / Self Care | Payer: BC Managed Care – PPO | Source: Ambulatory Visit | Attending: Family Medicine | Admitting: Family Medicine

## 2013-07-18 DIAGNOSIS — E041 Nontoxic single thyroid nodule: Secondary | ICD-10-CM | POA: Insufficient documentation

## 2013-07-18 DIAGNOSIS — R1312 Dysphagia, oropharyngeal phase: Secondary | ICD-10-CM

## 2013-09-21 ENCOUNTER — Encounter: Payer: Self-pay | Admitting: Gastroenterology

## 2013-10-13 ENCOUNTER — Other Ambulatory Visit (HOSPITAL_COMMUNITY): Payer: Self-pay | Admitting: Endocrinology

## 2013-10-13 DIAGNOSIS — E041 Nontoxic single thyroid nodule: Secondary | ICD-10-CM

## 2013-12-15 ENCOUNTER — Ambulatory Visit (HOSPITAL_COMMUNITY)
Admission: RE | Admit: 2013-12-15 | Discharge: 2013-12-15 | Disposition: A | Discharge status: Home / Self Care | Payer: BC Managed Care – PPO | Source: Ambulatory Visit | Attending: Endocrinology | Admitting: Endocrinology

## 2013-12-15 DIAGNOSIS — E041 Nontoxic single thyroid nodule: Secondary | ICD-10-CM | POA: Diagnosis present

## 2013-12-18 ENCOUNTER — Encounter: Payer: Self-pay | Admitting: Adult Health

## 2013-12-25 ENCOUNTER — Other Ambulatory Visit: Payer: Self-pay | Admitting: Endocrinology

## 2013-12-25 DIAGNOSIS — E041 Nontoxic single thyroid nodule: Secondary | ICD-10-CM

## 2014-01-02 ENCOUNTER — Ambulatory Visit
Admission: RE | Admit: 2014-01-02 | Discharge: 2014-01-02 | Disposition: A | Discharge status: Home / Self Care | Payer: BC Managed Care – PPO | Source: Ambulatory Visit | Attending: Endocrinology | Admitting: Endocrinology

## 2014-01-02 ENCOUNTER — Other Ambulatory Visit (HOSPITAL_COMMUNITY)
Admission: RE | Admit: 2014-01-02 | Discharge: 2014-01-02 | Disposition: A | Discharge status: Home / Self Care | Payer: BC Managed Care – PPO | Source: Ambulatory Visit | Attending: Interventional Radiology | Admitting: Interventional Radiology

## 2014-01-02 DIAGNOSIS — E041 Nontoxic single thyroid nodule: Secondary | ICD-10-CM | POA: Diagnosis not present

## 2014-08-29 ENCOUNTER — Encounter: Payer: Self-pay | Admitting: Advanced Practice Midwife

## 2014-08-29 ENCOUNTER — Ambulatory Visit (INDEPENDENT_AMBULATORY_CARE_PROVIDER_SITE_OTHER): Payer: BLUE CROSS/BLUE SHIELD | Admitting: Advanced Practice Midwife

## 2014-08-29 ENCOUNTER — Other Ambulatory Visit (HOSPITAL_COMMUNITY)
Admission: RE | Admit: 2014-08-29 | Discharge: 2014-08-29 | Disposition: A | Discharge status: Home / Self Care | Payer: BLUE CROSS/BLUE SHIELD | Source: Ambulatory Visit | Attending: Advanced Practice Midwife | Admitting: Advanced Practice Midwife

## 2014-08-29 VITALS — BP 102/76 | HR 76 | Ht 63.0 in | Wt 162.0 lb

## 2014-08-29 DIAGNOSIS — Z01419 Encounter for gynecological examination (general) (routine) without abnormal findings: Secondary | ICD-10-CM

## 2014-08-29 DIAGNOSIS — R319 Hematuria, unspecified: Secondary | ICD-10-CM | POA: Diagnosis not present

## 2014-08-29 DIAGNOSIS — Z1151 Encounter for screening for human papillomavirus (HPV): Secondary | ICD-10-CM | POA: Insufficient documentation

## 2014-08-29 LAB — POCT URINALYSIS DIPSTICK
GLUCOSE UA: NEGATIVE
Ketones, UA: NEGATIVE
LEUKOCYTES UA: NEGATIVE
NITRITE UA: NEGATIVE

## 2014-08-30 DIAGNOSIS — R319 Hematuria, unspecified: Secondary | ICD-10-CM | POA: Insufficient documentation

## 2014-08-30 NOTE — Progress Notes (Signed)
[name] 34 y.o.  Filed Vitals:   08/29/14 1453  BP: 102/76  Pulse: 76     Past Medical History: Past Medical History  Diagnosis Date  . Colonic polyp     History of  . Constipation   . Rectal pain   . Abdominal cramping   . Hemorrhoids   . Rectal bleed   . Menorrhagia 05/02/2013  . Other and unspecified ovarian cyst 05/30/2013  . Thyroid disease     Past Surgical History: Past Surgical History  Procedure Laterality Date  . Appendectomy  1991  . Tonsillectomy  1988  . Cervix lesion destruction    . Cystectomy  2002    Left wrist  . Cystectomy      Ovary    Family History: Family History  Problem Relation Age of Onset  . Crohn's disease Maternal Grandfather   . Birth defects Brother   . Heart disease Maternal Grandmother   . Breast cancer Maternal Grandmother   . Colon cancer Maternal Grandmother   . Cancer Maternal Grandmother     pancreatic    Social History: History  Substance Use Topics  . Smoking status: Never Smoker   . Smokeless tobacco: Never Used  . Alcohol Use: 0.0 oz/week    0 Standard drinks or equivalent per week     Comment: occassional wine    Allergies: No Known Allergies    Current outpatient prescriptions:  .  levothyroxine (SYNTHROID, LEVOTHROID) 50 MCG tablet, Take 50 mcg by mouth daily., Disp: , Rfl:  .  Multiple Vitamin (MULTIVITAMIN) tablet, Take 1 tablet by mouth daily.  , Disp: , Rfl:  .  vitamin B-12 (CYANOCOBALAMIN) 1000 MCG tablet, Take 1,000 mcg by mouth daily., Disp: , Rfl:   History of Present Illness: Here for pap/gyn physical.  Had physical with PCP recently, including labs, which were "all normal."  Has had intermittent hematuria for > year.  Sometimes sees it in toilet, has usually had it on diipstick.  No asso sx. Husband has had vasectomy, periods regular and "fine".    Review of Systems   Patient denies any headaches, blurred vision, shortness of breath, chest pain, abdominal pain, problems with bowel  movements, urination, or intercourse.   Physical Exam: General:  Well developed, well nourished, no acute distress Skin:  Warm and dry Neck:  Midline trachea, normal thyroid Lungs; Clear to auscultation bilaterally Breast:  No dominant palpable mass, retraction, or nipple discharge Cardiovascular: Regular rate and rhythm Abdomen:  Soft, non tender, no hepatosplenomegaly Pelvic:  External genitalia is normal in appearance.  The vagina is normal in appearance.  The cervix is bulbous.  Uterus is felt to be normal size, shape, and contour.  No adnexal masses or tenderness noted.  Extremities:  No swelling or varicosities noted Psych:  No mood changes.     Impression: Normal GYN exam Hematuria     Plan: If pap normal, repeat 3 years (pt may prefer q 2 years: that's OK with me).  Urology referral made.

## 2014-09-03 LAB — CYTOLOGY - PAP

## 2014-11-14 IMAGING — US US SOFT TISSUE HEAD/NECK
1 series · 14 of 25 positions shown · non-contrast
Comparison: 07/18/2013

CLINICAL DATA: 33-year-old female with thyroid nodule

EXAM:
THYROID ULTRASOUND
TECHNIQUE: Ultrasound examination of the thyroid gland and adjacent soft
tissues was performed.

[Series 1: us soft tissue head/neck · 0.04mm/px · 14 of 53 slices shown]
[im 1/53]
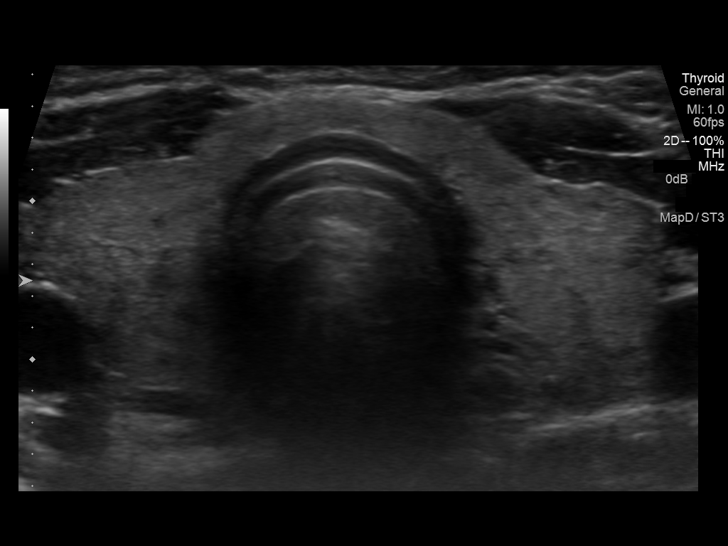
[im 5/53]
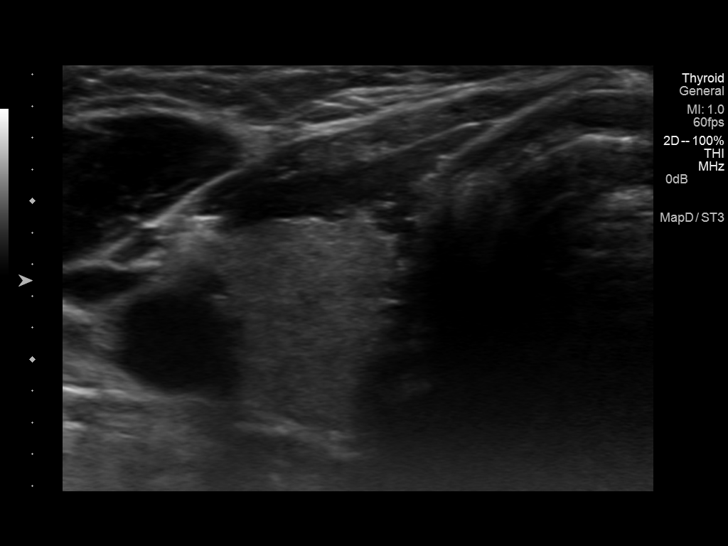
[im 9/53]
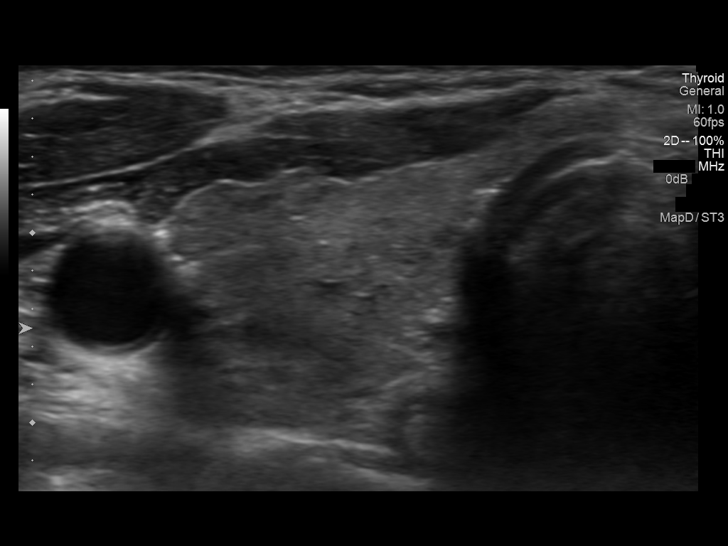
[im 14/53]
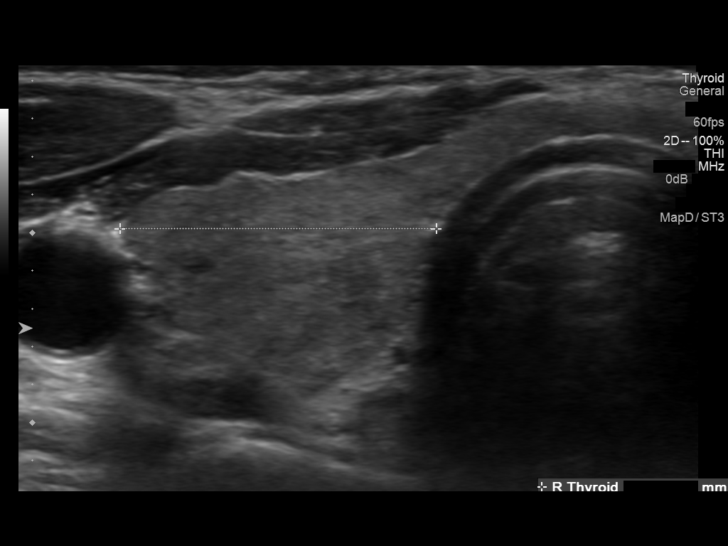
[im 18/53]
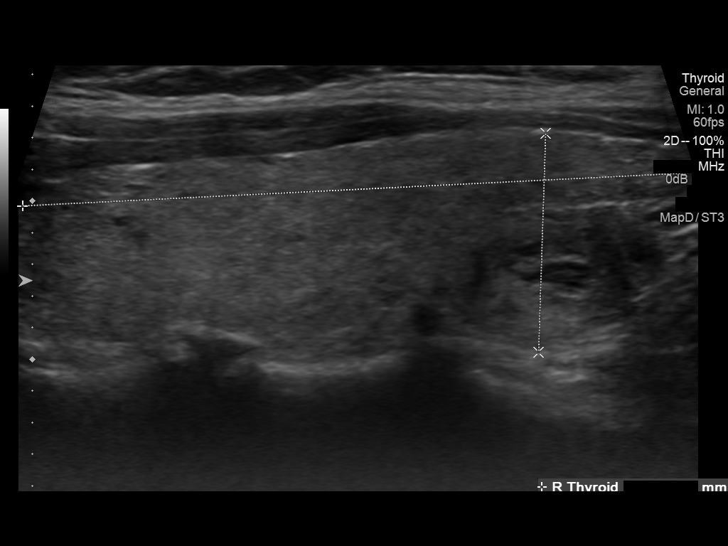
[im 20/53]
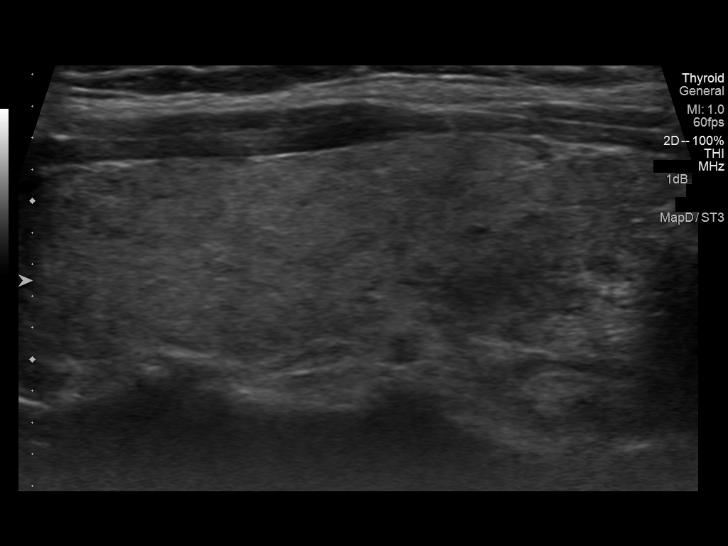
[im 24/53]
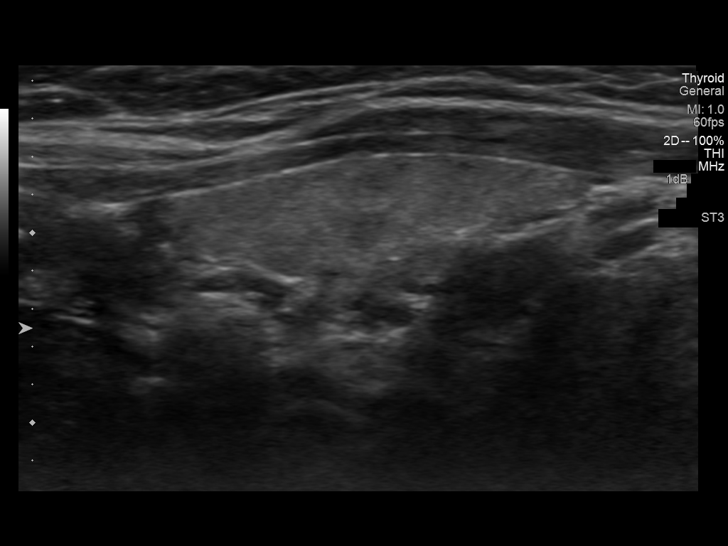
[im 29/53]
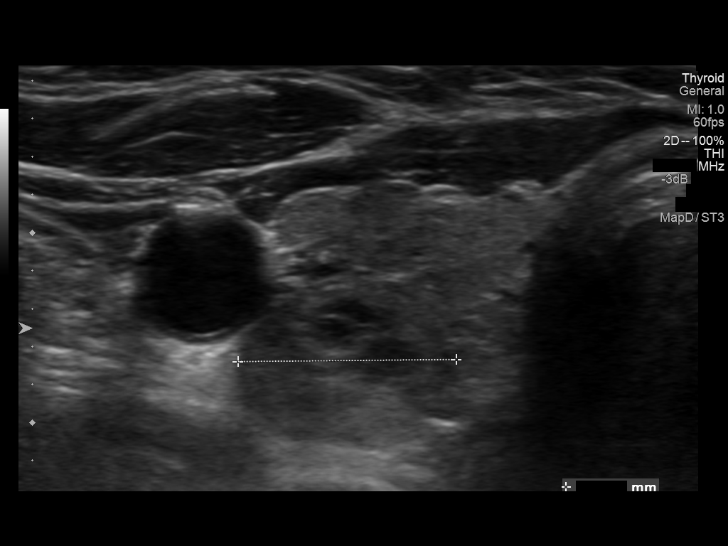
[im 33/53]
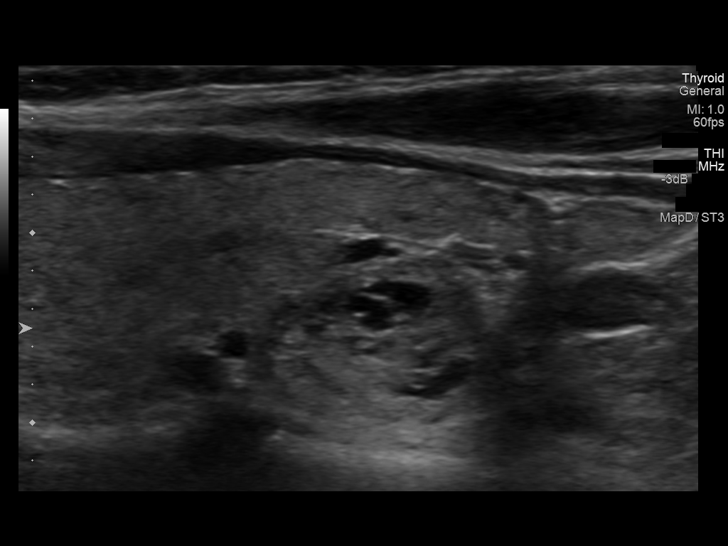
[im 35/53]
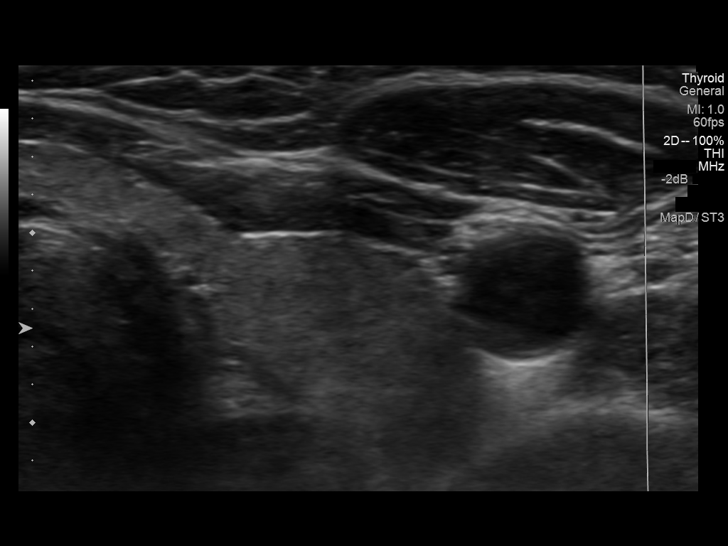
[im 40/53]
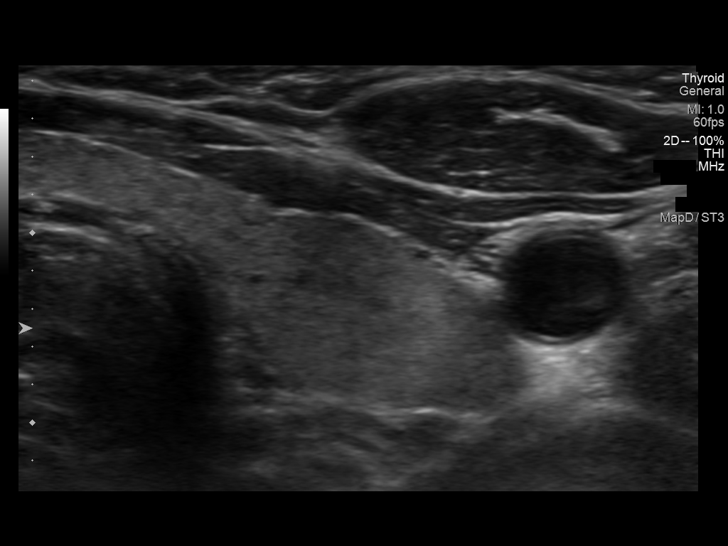
[im 44/53]
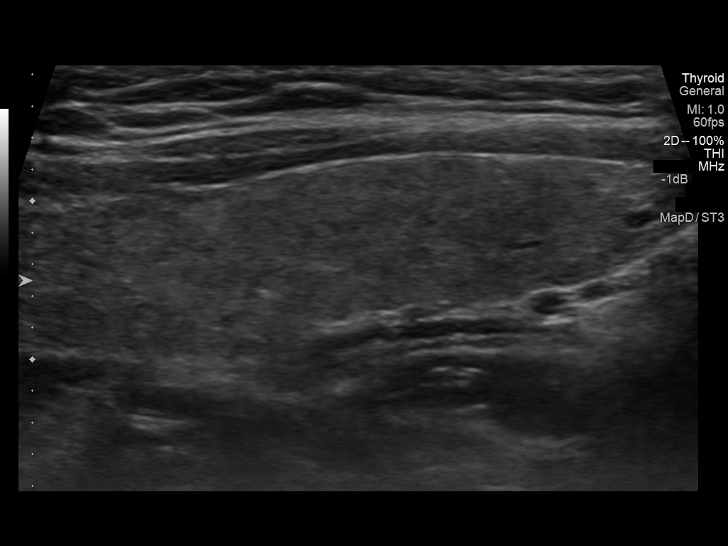
[im 48/53]
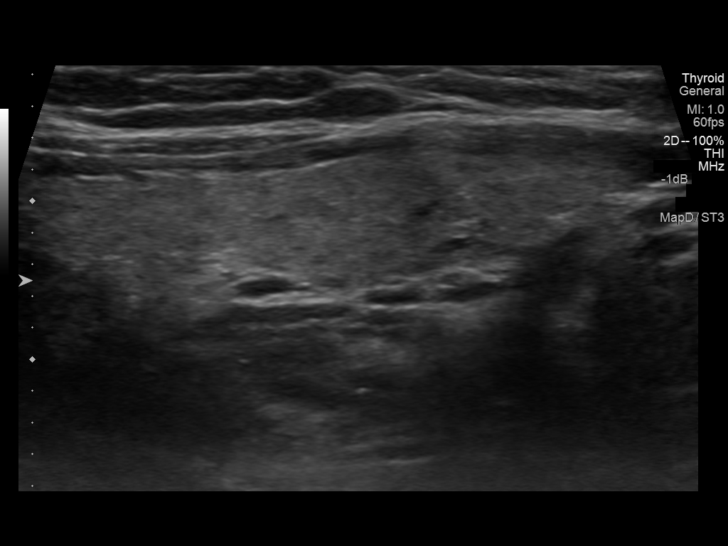
[im 53/53]
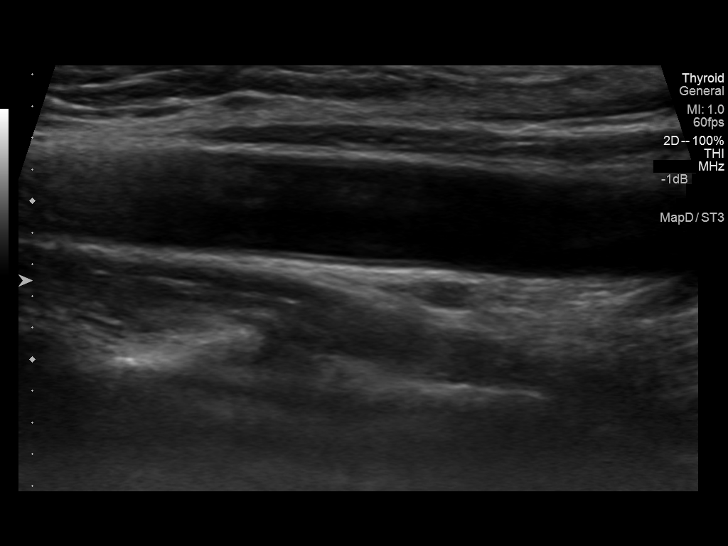

[14 of 25 positions shown; findings below may reference images not displayed]

FINDINGS: Right thyroid lobe

Measurements: 4.3 cm x 1.4 cm x 1.7 cm. Heterogeneous nodule at the
inferior pole the right thyroid measuring 1.3 cm x 0.9 cm x 1.2 cm.
This has not significantly increased in size, with last measurement
Wednesday July, 2013 measuring 1.2 cm x 1.1 cm x 0.8 cm

Left thyroid lobe

Measurements: 4.9 cm x 1.2 cm x 1.8 cm..  No nodules visualized.

Isthmus

Thickness: 3 mm.  No nodules visualized.

Lymphadenopathy

None visualized.
IMPRESSION: Relatively unchanged right inferior thyroid nodule.

Findings do not meet current SRU consensus criteria for biopsy.
Follow-up by clinical exam is recommended. If patient has known risk
factors for thyroid carcinoma, consider follow-up ultrasound in 12
months. If patient is clinically hyperthyroid, consider nuclear
medicine thyroid uptake and scan.Reference: Management of Thyroid
Nodules Detected at US: Society of Radiologists in Ultrasound

## 2014-12-02 IMAGING — US US THYROID BIOPSY
1 series · 8 of 8 positions shown · non-contrast
Comparison: Thyroid ultrasound- 12/15/2013; 07/18/2013

MEDICATIONS:
None

COMPLICATIONS:
None immediate

INDICATION: Indeterminate thyroid nodule

EXAM:
ULTRASOUND GUIDED FINE NEEDLE ASPIRATION OF INDETERMINATE THYROID
NODULE
TECHNIQUE: Informed written consent was obtained from the patient after a
discussion of the risks, benefits and alternatives to treatment.
Questions regarding the procedure were encouraged and answered. A
timeout was performed prior to the initiation of the procedure.

[Series 1: us thyroid biopsy · 0.06mm/px · 8 acquisitions, 8 frames shown]
[im 1/8]
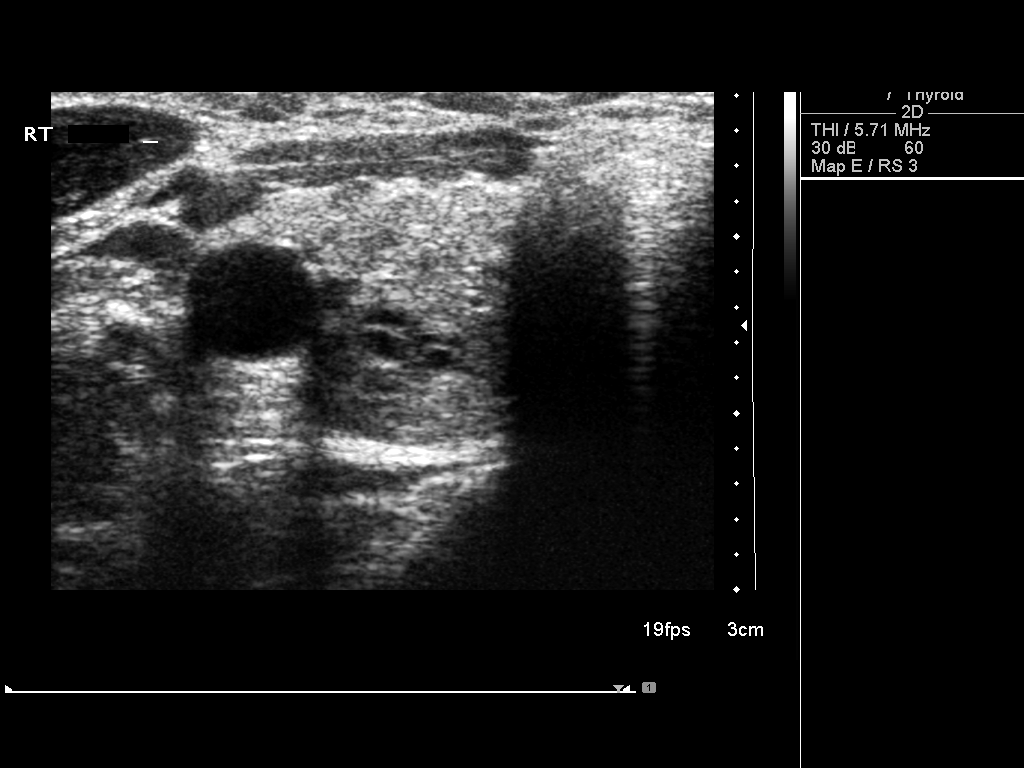
[im 2/8]
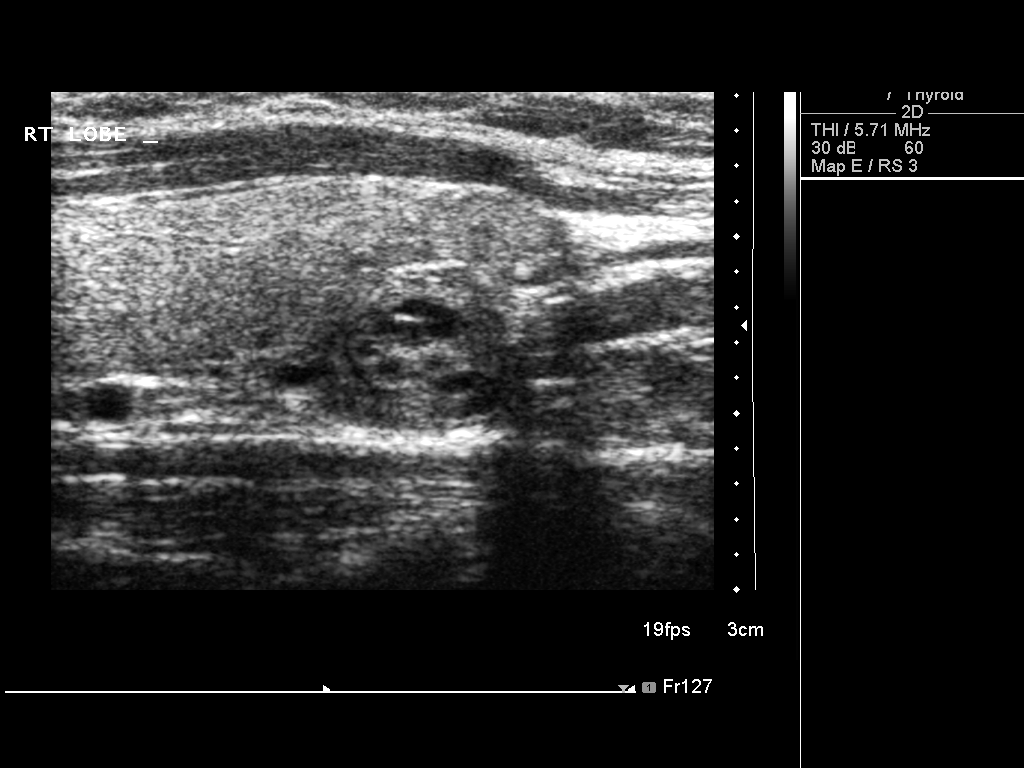
[im 3/8]
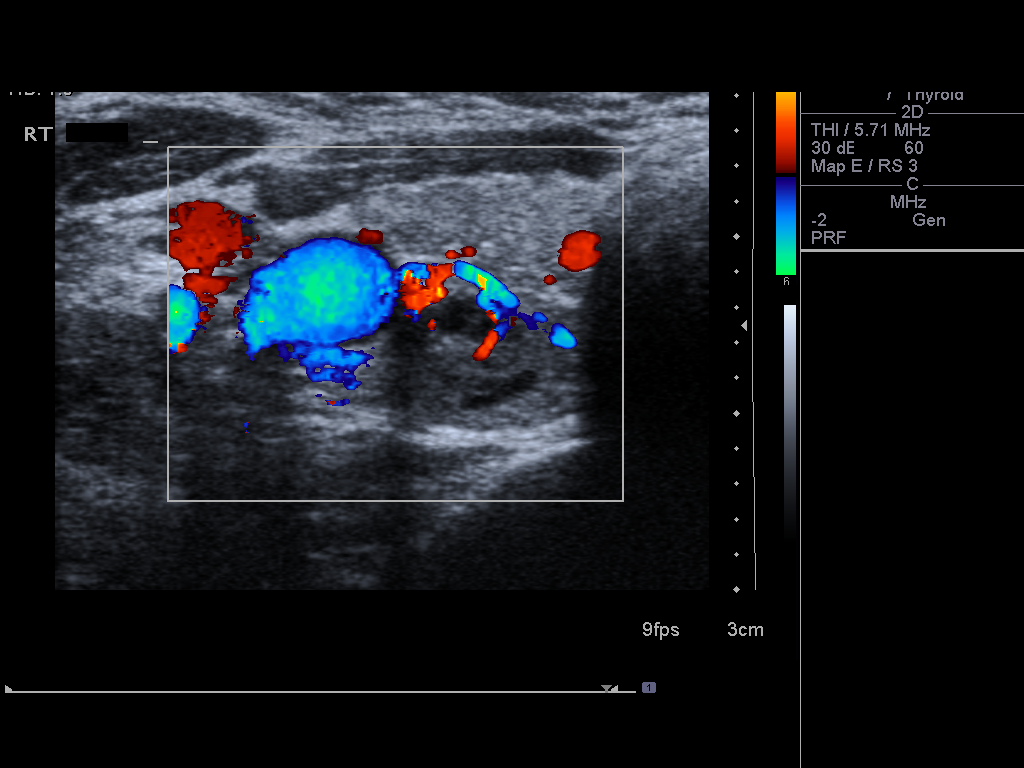
[im 4/8]
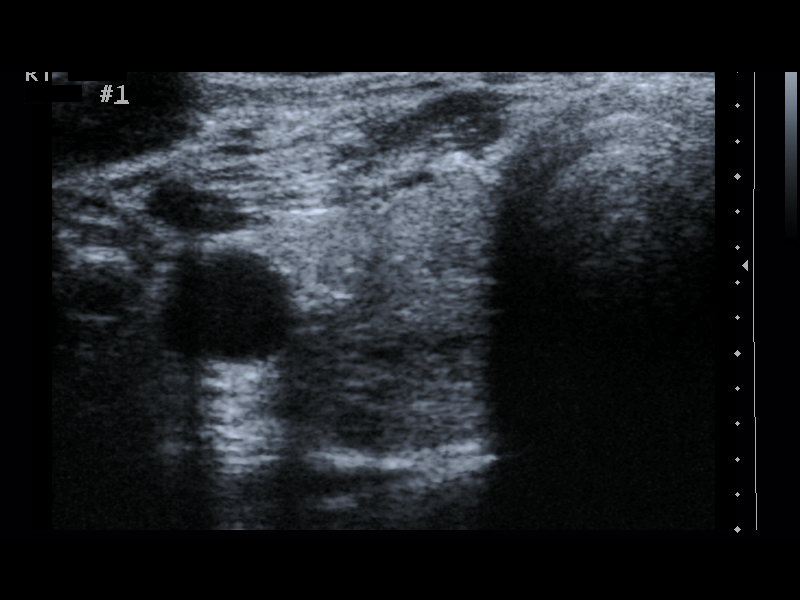
[im 5/8]
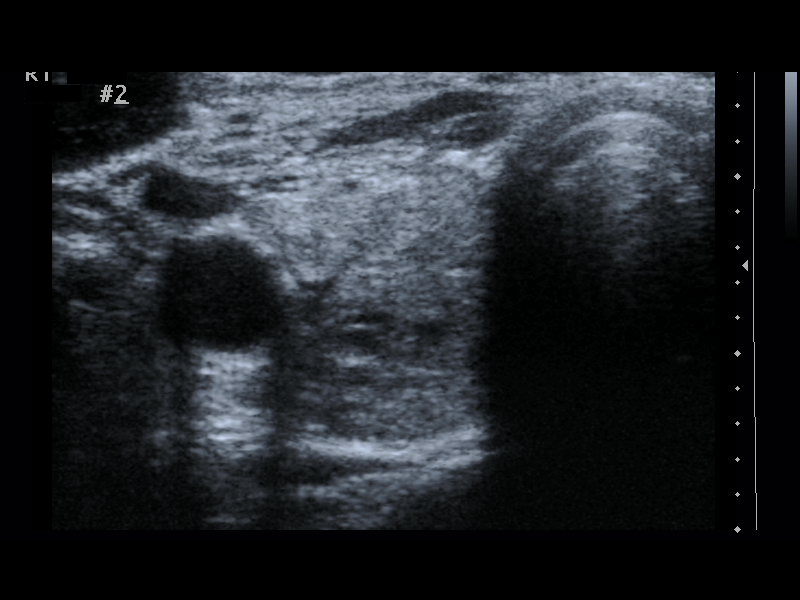
[im 6/8]
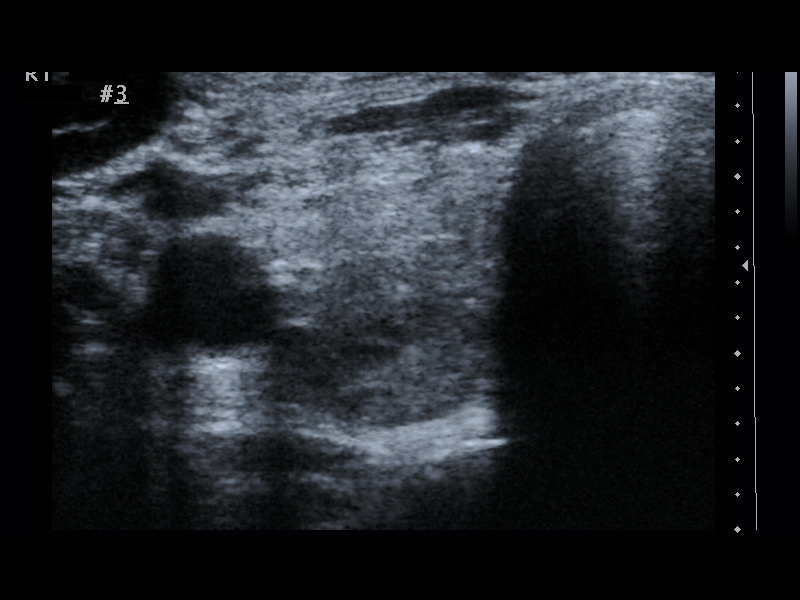
[im 7/8]
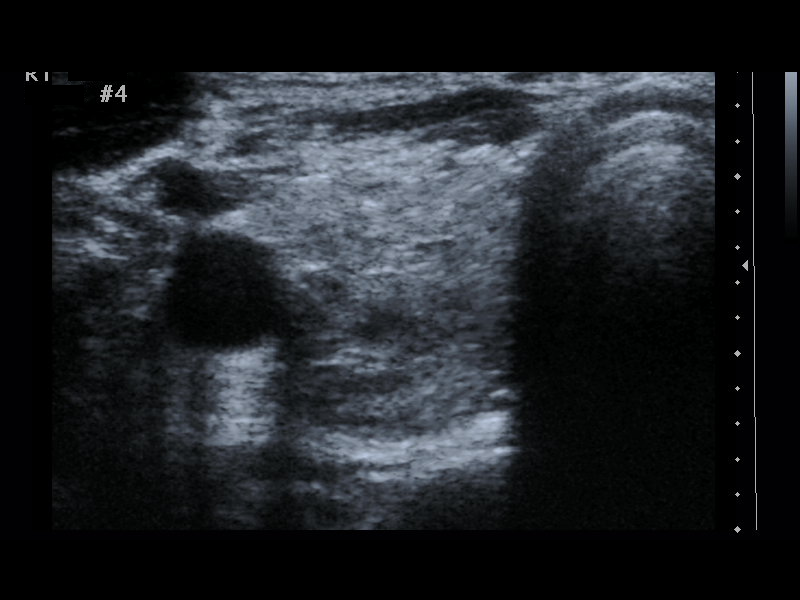
[im 8/8]
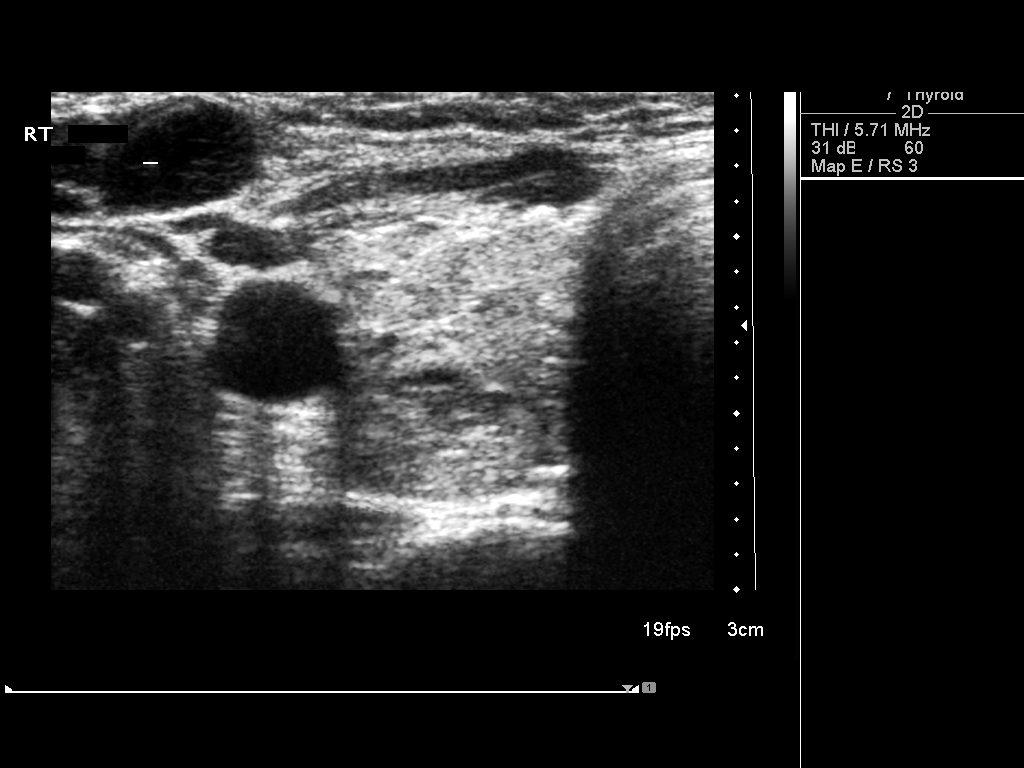

[8 of 8 positions shown; findings below may reference images not displayed]

Pre-procedural ultrasound scanning demonstrated grossly unchanged
appearance of solitary approximately 1.3 cm mixed echogenic
partially cystic predominantly solid nodule within the posterior
inferior aspect the right lobe of the thyroid.

The procedure was planned. The neck was prepped in the usual sterile
fashion, and a sterile drape was applied covering the operative
field. A timeout was performed prior to the initiation of the
procedure. Local anesthesia was provided with 1% lidocaine.

Under direct ultrasound guidance, 4 FNA biopsies were performed of
the solitary complex nodule with the posterior inferior aspect of
the right lobe of the thyroid with a 25 gauge needle. The samples
were prepared and submitted to pathology.

Limited post procedural scanning was negative for hematoma or
additional complication. Dressings were placed. The patient
tolerated the above procedures procedure well without immediate
postprocedural complication.
IMPRESSION: Technically successful ultrasound guided fine needle aspiration of
solitary complex nodule within the posterior inferior aspect of the
right lobe of the thyroid.

## 2015-02-13 ENCOUNTER — Other Ambulatory Visit (HOSPITAL_COMMUNITY): Payer: Self-pay | Admitting: Endocrinology

## 2015-02-13 DIAGNOSIS — E041 Nontoxic single thyroid nodule: Secondary | ICD-10-CM

## 2015-04-18 ENCOUNTER — Ambulatory Visit (HOSPITAL_COMMUNITY): Admission: RE | Admit: 2015-04-18 | Payer: BLUE CROSS/BLUE SHIELD | Source: Ambulatory Visit
